# Patient Record
Sex: Female | Born: 1935 | Race: White | Hispanic: No | Marital: Single | Smoking: Former smoker
Health system: Southern US, Community
[De-identification: ages and names within clinical notes are randomized; demographics above are authoritative.]

## PROBLEM LIST (undated history)

## (undated) DIAGNOSIS — I82409 Acute embolism and thrombosis of unspecified deep veins of unspecified lower extremity: Secondary | ICD-10-CM

## (undated) DIAGNOSIS — J40 Bronchitis, not specified as acute or chronic: Secondary | ICD-10-CM

## (undated) DIAGNOSIS — D649 Anemia, unspecified: Secondary | ICD-10-CM

## (undated) DIAGNOSIS — K5790 Diverticulosis of intestine, part unspecified, without perforation or abscess without bleeding: Secondary | ICD-10-CM

## (undated) DIAGNOSIS — M1990 Unspecified osteoarthritis, unspecified site: Secondary | ICD-10-CM

## (undated) DIAGNOSIS — I2510 Atherosclerotic heart disease of native coronary artery without angina pectoris: Secondary | ICD-10-CM

## (undated) DIAGNOSIS — I10 Essential (primary) hypertension: Secondary | ICD-10-CM

## (undated) DIAGNOSIS — K219 Gastro-esophageal reflux disease without esophagitis: Secondary | ICD-10-CM

## (undated) DIAGNOSIS — I2699 Other pulmonary embolism without acute cor pulmonale: Secondary | ICD-10-CM

## (undated) DIAGNOSIS — Z87442 Personal history of urinary calculi: Secondary | ICD-10-CM

## (undated) HISTORY — PX: CHOLECYSTECTOMY: SHX55

## (undated) HISTORY — PX: COLONOSCOPY W/ POLYPECTOMY: SHX1380

## (undated) HISTORY — PX: CORONARY ANGIOPLASTY: SHX604

## (undated) HISTORY — PX: EYE SURGERY: SHX253

## (undated) HISTORY — PX: ANKLE SURGERY: SHX546

## (undated) HISTORY — PX: TONSILLECTOMY: SUR1361

## (undated) HISTORY — PX: APPENDECTOMY: SHX54

## (undated) HISTORY — PX: CARDIAC CATHETERIZATION: SHX172

## (undated) HISTORY — PX: HIP ARTHROPLASTY: SHX981

## (undated) HISTORY — PX: ABDOMINAL HYSTERECTOMY: SHX81

---

## 2015-08-14 ENCOUNTER — Other Ambulatory Visit: Payer: Self-pay | Admitting: Neurological Surgery

## 2015-08-15 NOTE — Pre-Procedure Instructions (Signed)
Michelle NorthMargaret Ritter  08/15/2015     Your procedure is scheduled on : Monday August 19, 2015   [facility] Admitting at 12:30 PM.  Call this number if you have problems the morning of surgery: [phone]   Remember:  Do not eat food or drink liquids after midnight.  Take these medicines the morning of surgery with A SIP OF WATER : Acetaminophen (Tylenol) if needed, Amlodipine (Norvasc), Zegerid if needed, Pantoprazole (Protonix), Sertraline (Zoloft)   Stop taking any vitamins, herbal medications, Ibuprofen, Advil, Motrin, Aleve, etc   Do not wear jewelry, make-up or nail polish.  Do not wear lotions, powders, or perfumes.    Do not shave 48 hours prior to surgery.    Do not bring valuables to the hospital.  [facility] is not responsible for any belongings or valuables.  Contacts, dentures or bridgework may not be worn into surgery.  Leave your suitcase in the car.  After surgery it may be brought to your room.  For patients admitted to the hospital, discharge time will be determined by your treatment team.  Patients discharged the day of surgery will not be allowed to drive home.   Name and phone number of your driver:    Special instructions:  Shower using CHG soap the night before and the morning of your surgery  Please read over the following fact sheets that you were given. Pain Booklet, Coughing and Deep Breathing, MRSA Information and Surgical Site Infection Prevention

## 2015-08-16 ENCOUNTER — Encounter (HOSPITAL_COMMUNITY)
Admission: RE | Admit: 2015-08-16 | Discharge: 2015-08-16 | Disposition: A | Discharge status: Home / Self Care | Payer: Medicare Other | Source: Ambulatory Visit | Attending: Neurological Surgery | Admitting: Neurological Surgery

## 2015-08-16 ENCOUNTER — Encounter (HOSPITAL_COMMUNITY): Payer: Self-pay

## 2015-08-16 HISTORY — DX: Anemia, unspecified: D64.9

## 2015-08-16 HISTORY — DX: Gastro-esophageal reflux disease without esophagitis: K21.9

## 2015-08-16 HISTORY — DX: Acute embolism and thrombosis of unspecified deep veins of unspecified lower extremity: I82.409

## 2015-08-16 HISTORY — DX: Bronchitis, not specified as acute or chronic: J40

## 2015-08-16 HISTORY — DX: Other pulmonary embolism without acute cor pulmonale: I26.99

## 2015-08-16 HISTORY — DX: Unspecified osteoarthritis, unspecified site: M19.90

## 2015-08-16 HISTORY — DX: Atherosclerotic heart disease of native coronary artery without angina pectoris: I25.10

## 2015-08-16 HISTORY — DX: Personal history of urinary calculi: Z87.442

## 2015-08-16 HISTORY — DX: Diverticulosis of intestine, part unspecified, without perforation or abscess without bleeding: K57.90

## 2015-08-16 HISTORY — DX: Essential (primary) hypertension: I10

## 2015-08-16 LAB — CBC
HEMATOCRIT: 40.5 % (ref 36.0–46.0)
HEMOGLOBIN: 13.1 g/dL (ref 12.0–15.0)
MCH: 31 pg (ref 26.0–34.0)
MCHC: 32.3 g/dL (ref 30.0–36.0)
MCV: 96 fL (ref 78.0–100.0)
Platelets: 197 10*3/uL (ref 150–400)
RBC: 4.22 MIL/uL (ref 3.87–5.11)
RDW: 14 % (ref 11.5–15.5)
WBC: 6.6 10*3/uL (ref 4.0–10.5)

## 2015-08-16 LAB — BASIC METABOLIC PANEL
ANION GAP: 9 (ref 5–15)
BUN: 15 mg/dL (ref 6–20)
CALCIUM: 9.1 mg/dL (ref 8.9–10.3)
CHLORIDE: 100 mmol/L — AB (ref 101–111)
CO2: 26 mmol/L (ref 22–32)
Creatinine, Ser: 0.69 mg/dL (ref 0.44–1.00)
GFR calc non Af Amer: >60 mL/min (ref >60)
Glucose, Bld: 103 mg/dL — ABNORMAL HIGH (ref 65–99)
Potassium: 3.8 mmol/L (ref 3.5–5.1)
SODIUM: 135 mmol/L (ref 135–145)

## 2015-08-16 LAB — SURGICAL PCR SCREEN
MRSA, PCR: NEGATIVE
STAPHYLOCOCCUS AUREUS: NEGATIVE

## 2015-08-16 NOTE — Progress Notes (Signed)
PCP is Lowella DellZachary Sandbulte  Cardiologist is Bill SalinasKerry Gilliland at [facility]. Patient informed Nurse that she had a cardiac [facility], and has one stent placed in her heart. LOV with Dr. Lehman PromGilliand was "sometime this year." Will request records.  Patient denied having any acute cardiac or pulmonary issues

## 2015-08-16 NOTE — Progress Notes (Signed)
Anesthesia Chart Review:  Pt is 81 year old female scheduled for L3-4, L4-5 laminectomy with coflex on 08/19/2015 with Dr. Danielle DessElsner.   PCP is Dr. Lowella DellZachary Sandbulte (care everywhere). Cardiologist is Dr. Bill SalinasKerry Gilliland at [facility] in [place] (care everywhere), last office visit 04/23/15; f/u recommended in 1 year.   PMH includes: CAD (BMS to LAD 08/2013; last cath 01/2015), sick sinus syndrome, HTN, DVT, PE, anemia, GERD. Former smoker. BMI 28.   Medications include: amlodipine, ASA, iron, zegerid, protonix  Preoperative labs reviewed.    EKG 08/16/2015: sinus bradycardia (59 bpm).   Cardiac cath 02/06/2015 (care everywhere): 30-40% to LAD stent, 5-10% Cx, 40% RCA, 90% ostial Diagonal (unchanged from prior cath 2014) => Med Rx.  Echo 08/31/2013 (care everywhere): - The left ventricle is normal in size. - There is mild concentric left ventricular hypertrophy with normal wall motion and ejection fraction 60-65%. - There is mild (1+) tricuspid regurgitation. - The aortic valve is trileaflet with thin, pliable leaflets that move normally. - Transmitral Doppler flow pattern is normal for age. - The left atrium is mildly dilated.  Reviewed case with Dr. Renold DonGermeroth.   If no changes, I anticipate pt can proceed with surgery as scheduled.   Rica Mastngela Gatlyn Lipari, FNP-BC [facility]Anesthesiology Phone: [phone] 08/16/2015 4:37 PM

## 2015-08-19 ENCOUNTER — Encounter (HOSPITAL_COMMUNITY): Payer: Self-pay | Admitting: *Deleted

## 2015-08-19 ENCOUNTER — Ambulatory Visit (HOSPITAL_COMMUNITY): Payer: Medicare Other

## 2015-08-19 ENCOUNTER — Ambulatory Visit (HOSPITAL_COMMUNITY): Payer: Medicare Other | Admitting: Anesthesiology

## 2015-08-19 ENCOUNTER — Inpatient Hospital Stay (HOSPITAL_COMMUNITY)
Admission: AD | Admit: 2015-08-19 | Discharge: 2015-08-21 | DRG: 518 | Disposition: A | Discharge status: Home / Self Care | Payer: Medicare Other | Source: Ambulatory Visit | Attending: Neurological Surgery | Admitting: Neurological Surgery

## 2015-08-19 ENCOUNTER — Ambulatory Visit (HOSPITAL_COMMUNITY): Payer: Medicare Other | Admitting: Emergency Medicine

## 2015-08-19 ENCOUNTER — Encounter (HOSPITAL_COMMUNITY)
Admission: AD | Disposition: A | Discharge status: Home / Self Care | Payer: Self-pay | Source: Ambulatory Visit | Attending: Neurological Surgery

## 2015-08-19 DIAGNOSIS — I10 Essential (primary) hypertension: Secondary | ICD-10-CM | POA: Diagnosis present

## 2015-08-19 DIAGNOSIS — Z7982 Long term (current) use of aspirin: Secondary | ICD-10-CM | POA: Diagnosis not present

## 2015-08-19 DIAGNOSIS — Z86718 Personal history of other venous thrombosis and embolism: Secondary | ICD-10-CM | POA: Diagnosis not present

## 2015-08-19 DIAGNOSIS — Z87891 Personal history of nicotine dependence: Secondary | ICD-10-CM

## 2015-08-19 DIAGNOSIS — I2510 Atherosclerotic heart disease of native coronary artery without angina pectoris: Secondary | ICD-10-CM | POA: Diagnosis present

## 2015-08-19 DIAGNOSIS — Z419 Encounter for procedure for purposes other than remedying health state, unspecified: Secondary | ICD-10-CM

## 2015-08-19 DIAGNOSIS — Z01812 Encounter for preprocedural laboratory examination: Secondary | ICD-10-CM

## 2015-08-19 DIAGNOSIS — M4316 Spondylolisthesis, lumbar region: Secondary | ICD-10-CM | POA: Diagnosis present

## 2015-08-19 DIAGNOSIS — M4806 Spinal stenosis, lumbar region: Principal | ICD-10-CM | POA: Diagnosis present

## 2015-08-19 DIAGNOSIS — M4726 Other spondylosis with radiculopathy, lumbar region: Secondary | ICD-10-CM | POA: Diagnosis present

## 2015-08-19 DIAGNOSIS — Z955 Presence of coronary angioplasty implant and graft: Secondary | ICD-10-CM | POA: Diagnosis not present

## 2015-08-19 DIAGNOSIS — K219 Gastro-esophageal reflux disease without esophagitis: Secondary | ICD-10-CM | POA: Diagnosis present

## 2015-08-19 DIAGNOSIS — M48061 Spinal stenosis, lumbar region without neurogenic claudication: Secondary | ICD-10-CM | POA: Diagnosis present

## 2015-08-19 DIAGNOSIS — Z01810 Encounter for preprocedural cardiovascular examination: Secondary | ICD-10-CM | POA: Diagnosis not present

## 2015-08-19 DIAGNOSIS — Z79899 Other long term (current) drug therapy: Secondary | ICD-10-CM | POA: Diagnosis not present

## 2015-08-19 DIAGNOSIS — Z86711 Personal history of pulmonary embolism: Secondary | ICD-10-CM | POA: Diagnosis not present

## 2015-08-19 DIAGNOSIS — M419 Scoliosis, unspecified: Secondary | ICD-10-CM | POA: Diagnosis present

## 2015-08-19 DIAGNOSIS — M549 Dorsalgia, unspecified: Secondary | ICD-10-CM | POA: Diagnosis present

## 2015-08-19 HISTORY — PX: LUMBAR LAMINECTOMY WITH COFLEX 2 LEVEL: SHX6515

## 2015-08-19 SURGERY — LUMBAR LAMINECTOMY WITH COFLEX 2 LEVEL
Anesthesia: General | Site: Back | Laterality: Left

## 2015-08-19 MED ORDER — OXYCODONE-ACETAMINOPHEN 5-325 MG PO TABS
1.0000 | ORAL_TABLET | ORAL | Status: DC | PRN
  Administered 2015-08-19: 1 via ORAL
  Filled 2015-08-19: qty 1

## 2015-08-19 MED ORDER — POLYETHYLENE GLYCOL 3350 17 G PO PACK: 17.0000 g | PACK | Freq: Every day | ORAL | Status: DC | PRN

## 2015-08-19 MED ORDER — MIDAZOLAM HCL 2 MG/2ML IJ SOLN
INTRAMUSCULAR | Status: AC
  Filled 2015-08-19: qty 4

## 2015-08-19 MED ORDER — PHENOL 1.4 % MT LIQD: 1.0000 | OROMUCOSAL | Status: DC | PRN

## 2015-08-19 MED ORDER — PANTOPRAZOLE SODIUM 40 MG PO TBEC
40.0000 mg | DELAYED_RELEASE_TABLET | Freq: Two times a day (BID) | ORAL | Status: DC
  Administered 2015-08-19 – 2015-08-20 (×3): 40 mg via ORAL
  Filled 2015-08-19 (×3): qty 1

## 2015-08-19 MED ORDER — SERTRALINE HCL 50 MG PO TABS
25.0000 mg | ORAL_TABLET | Freq: Every day | ORAL | Status: DC
  Administered 2015-08-19 – 2015-08-20 (×2): 25 mg via ORAL
  Filled 2015-08-19 (×2): qty 1

## 2015-08-19 MED ORDER — ONDANSETRON HCL 4 MG/2ML IJ SOLN
INTRAMUSCULAR | Status: DC | PRN
  Administered 2015-08-19: 4 mg via INTRAVENOUS

## 2015-08-19 MED ORDER — ONDANSETRON HCL 4 MG/2ML IJ SOLN
INTRAMUSCULAR | Status: AC
  Filled 2015-08-19: qty 2

## 2015-08-19 MED ORDER — VANCOMYCIN HCL IN DEXTROSE 1-5 GM/200ML-% IV SOLN: 1000.0000 mg | Freq: Once | INTRAVENOUS | Status: DC

## 2015-08-19 MED ORDER — PROMETHAZINE HCL 25 MG/ML IJ SOLN
INTRAMUSCULAR | Status: AC
  Filled 2015-08-19: qty 1

## 2015-08-19 MED ORDER — SODIUM CHLORIDE 0.9 % IJ SOLN
3.0000 mL | Freq: Two times a day (BID) | INTRAMUSCULAR | Status: DC
  Administered 2015-08-20: 3 mL via INTRAVENOUS

## 2015-08-19 MED ORDER — FLEET ENEMA 7-19 GM/118ML RE ENEM: 1.0000 | ENEMA | Freq: Once | RECTAL | Status: DC | PRN

## 2015-08-19 MED ORDER — NITROGLYCERIN 0.4 MG SL SUBL
0.4000 mg | SUBLINGUAL_TABLET | SUBLINGUAL | Status: DC | PRN
Start: 2015-08-19 — End: 2015-08-21

## 2015-08-19 MED ORDER — HYDROCODONE-ACETAMINOPHEN 5-325 MG PO TABS
1.0000 | ORAL_TABLET | ORAL | Status: DC | PRN
  Administered 2015-08-19: 2 via ORAL
  Filled 2015-08-19: qty 2

## 2015-08-19 MED ORDER — LIDOCAINE HCL (CARDIAC) 20 MG/ML IV SOLN
INTRAVENOUS | Status: AC
  Filled 2015-08-19: qty 5

## 2015-08-19 MED ORDER — GLYCOPYRROLATE 0.2 MG/ML IJ SOLN
INTRAMUSCULAR | Status: DC | PRN
  Administered 2015-08-19: 0.4 mg via INTRAVENOUS

## 2015-08-19 MED ORDER — NEOSTIGMINE METHYLSULFATE 10 MG/10ML IV SOLN
INTRAVENOUS | Status: DC | PRN
  Administered 2015-08-19: 3 mg via INTRAVENOUS

## 2015-08-19 MED ORDER — EPHEDRINE SULFATE 50 MG/ML IJ SOLN
INTRAMUSCULAR | Status: DC | PRN
  Administered 2015-08-19: 10 mg via INTRAVENOUS

## 2015-08-19 MED ORDER — MENTHOL 3 MG MT LOZG: 1.0000 | LOZENGE | OROMUCOSAL | Status: DC | PRN

## 2015-08-19 MED ORDER — SODIUM CHLORIDE 0.9 % IV SOLN: 250.0000 mL | INTRAVENOUS | Status: DC

## 2015-08-19 MED ORDER — FENTANYL CITRATE (PF) 250 MCG/5ML IJ SOLN
INTRAMUSCULAR | Status: AC
  Filled 2015-08-19: qty 5

## 2015-08-19 MED ORDER — NEOSTIGMINE METHYLSULFATE 10 MG/10ML IV SOLN
INTRAVENOUS | Status: AC
  Filled 2015-08-19: qty 1

## 2015-08-19 MED ORDER — LIDOCAINE-EPINEPHRINE 1 %-1:100000 IJ SOLN
INTRAMUSCULAR | Status: DC | PRN
  Administered 2015-08-19: 10 mL

## 2015-08-19 MED ORDER — ACETAMINOPHEN 325 MG PO TABS
650.0000 mg | ORAL_TABLET | ORAL | Status: DC | PRN
  Administered 2015-08-21: 650 mg via ORAL
  Filled 2015-08-19: qty 2

## 2015-08-19 MED ORDER — ONDANSETRON HCL 4 MG/2ML IJ SOLN
4.0000 mg | INTRAMUSCULAR | Status: DC | PRN
  Administered 2015-08-19 – 2015-08-20 (×3): 4 mg via INTRAVENOUS
  Filled 2015-08-19 (×3): qty 2

## 2015-08-19 MED ORDER — METHOCARBAMOL 500 MG PO TABS
500.0000 mg | ORAL_TABLET | Freq: Four times a day (QID) | ORAL | Status: DC | PRN
  Administered 2015-08-19: 500 mg via ORAL
  Filled 2015-08-19: qty 1

## 2015-08-19 MED ORDER — LACTATED RINGERS IV SOLN
INTRAVENOUS | Status: DC
  Administered 2015-08-19 (×3): via INTRAVENOUS

## 2015-08-19 MED ORDER — ROCURONIUM BROMIDE 50 MG/5ML IV SOLN
INTRAVENOUS | Status: AC
  Filled 2015-08-19: qty 1

## 2015-08-19 MED ORDER — MORPHINE SULFATE (PF) 2 MG/ML IV SOLN
1.0000 mg | INTRAVENOUS | Status: DC | PRN
  Administered 2015-08-19 – 2015-08-20 (×2): 2 mg via INTRAVENOUS
  Filled 2015-08-19 (×2): qty 1

## 2015-08-19 MED ORDER — ROCURONIUM BROMIDE 100 MG/10ML IV SOLN
INTRAVENOUS | Status: DC | PRN
  Administered 2015-08-19: 40 mg via INTRAVENOUS

## 2015-08-19 MED ORDER — PANTOPRAZOLE SODIUM 40 MG PO TBEC: 40.0000 mg | DELAYED_RELEASE_TABLET | Freq: Every day | ORAL | Status: DC

## 2015-08-19 MED ORDER — HEMOSTATIC AGENTS (NO CHARGE) OPTIME
TOPICAL | Status: DC | PRN
  Administered 2015-08-19: 1 via TOPICAL

## 2015-08-19 MED ORDER — PHENYLEPHRINE HCL 10 MG/ML IJ SOLN
INTRAMUSCULAR | Status: DC | PRN
  Administered 2015-08-19 (×4): 80 ug via INTRAVENOUS

## 2015-08-19 MED ORDER — LIDOCAINE HCL (CARDIAC) 20 MG/ML IV SOLN
INTRAVENOUS | Status: DC | PRN
  Administered 2015-08-19: 50 mg via INTRAVENOUS

## 2015-08-19 MED ORDER — PROPOFOL 10 MG/ML IV BOLUS
INTRAVENOUS | Status: DC | PRN
  Administered 2015-08-19: 120 mg via INTRAVENOUS

## 2015-08-19 MED ORDER — ACETAMINOPHEN 10 MG/ML IV SOLN
INTRAVENOUS | Status: AC
  Administered 2015-08-19: 1000 mg via INTRAVENOUS
  Filled 2015-08-19: qty 100

## 2015-08-19 MED ORDER — SODIUM CHLORIDE 0.9 % IR SOLN
Status: DC | PRN
  Administered 2015-08-19: 16:00:00

## 2015-08-19 MED ORDER — HYDROMORPHONE HCL 1 MG/ML IJ SOLN
0.2500 mg | INTRAMUSCULAR | Status: DC | PRN
  Administered 2015-08-19: 0.25 mg via INTRAVENOUS

## 2015-08-19 MED ORDER — GLYCOPYRROLATE 0.2 MG/ML IJ SOLN
INTRAMUSCULAR | Status: AC
  Filled 2015-08-19: qty 2

## 2015-08-19 MED ORDER — SODIUM CHLORIDE 0.9 % IJ SOLN: 3.0000 mL | INTRAMUSCULAR | Status: DC | PRN

## 2015-08-19 MED ORDER — DOCUSATE SODIUM 100 MG PO CAPS
100.0000 mg | ORAL_CAPSULE | Freq: Two times a day (BID) | ORAL | Status: DC
  Administered 2015-08-19 – 2015-08-20 (×3): 100 mg via ORAL
  Filled 2015-08-19 (×3): qty 1

## 2015-08-19 MED ORDER — ALUM & MAG HYDROXIDE-SIMETH 200-200-20 MG/5ML PO SUSP
30.0000 mL | Freq: Four times a day (QID) | ORAL | Status: DC | PRN
  Administered 2015-08-20: 30 mL via ORAL
  Filled 2015-08-19: qty 30

## 2015-08-19 MED ORDER — FENTANYL CITRATE (PF) 100 MCG/2ML IJ SOLN
INTRAMUSCULAR | Status: DC | PRN
  Administered 2015-08-19: 50 ug via INTRAVENOUS
  Administered 2015-08-19: 100 ug via INTRAVENOUS

## 2015-08-19 MED ORDER — BUPIVACAINE HCL (PF) 0.5 % IJ SOLN
INTRAMUSCULAR | Status: DC | PRN
  Administered 2015-08-19: 20 mL
  Administered 2015-08-19: 10 mL

## 2015-08-19 MED ORDER — 0.9 % SODIUM CHLORIDE (POUR BTL) OPTIME
TOPICAL | Status: DC | PRN
  Administered 2015-08-19: 1000 mL

## 2015-08-19 MED ORDER — BISACODYL 10 MG RE SUPP: 10.0000 mg | Freq: Every day | RECTAL | Status: DC | PRN

## 2015-08-19 MED ORDER — HYDROMORPHONE HCL 1 MG/ML IJ SOLN
INTRAMUSCULAR | Status: AC
  Filled 2015-08-19: qty 1

## 2015-08-19 MED ORDER — SENNA 8.6 MG PO TABS
1.0000 | ORAL_TABLET | Freq: Two times a day (BID) | ORAL | Status: DC
  Administered 2015-08-19 – 2015-08-20 (×3): 8.6 mg via ORAL
  Filled 2015-08-19 (×3): qty 1

## 2015-08-19 MED ORDER — PROMETHAZINE HCL 25 MG/ML IJ SOLN: 6.2500 mg | INTRAMUSCULAR | Status: DC | PRN

## 2015-08-19 MED ORDER — THROMBIN 5000 UNITS EX SOLR
CUTANEOUS | Status: DC | PRN
  Administered 2015-08-19 (×2): 5000 [IU] via TOPICAL

## 2015-08-19 MED ORDER — AMLODIPINE BESYLATE 5 MG PO TABS
5.0000 mg | ORAL_TABLET | Freq: Every day | ORAL | Status: DC
  Administered 2015-08-20: 5 mg via ORAL
  Filled 2015-08-19 (×2): qty 1

## 2015-08-19 MED ORDER — ACETAMINOPHEN 650 MG RE SUPP: 650.0000 mg | RECTAL | Status: DC | PRN

## 2015-08-19 MED ORDER — MEPERIDINE HCL 25 MG/ML IJ SOLN: 6.2500 mg | INTRAMUSCULAR | Status: DC | PRN

## 2015-08-19 MED ORDER — DEXTROSE 5 % IV SOLN
500.0000 mg | Freq: Four times a day (QID) | INTRAVENOUS | Status: DC | PRN
  Filled 2015-08-19: qty 5

## 2015-08-19 MED ORDER — VANCOMYCIN HCL IN DEXTROSE 1-5 GM/200ML-% IV SOLN
INTRAVENOUS | Status: AC
  Administered 2015-08-19: 1000 mg via INTRAVENOUS
  Filled 2015-08-19: qty 200

## 2015-08-19 MED ORDER — PROPOFOL 10 MG/ML IV BOLUS
INTRAVENOUS | Status: AC
  Filled 2015-08-19: qty 20

## 2015-08-19 SURGICAL SUPPLY — 51 items
BAG DECANTER FOR FLEXI CONT (MISCELLANEOUS) ×3 IMPLANT
BLADE CLIPPER SURG (BLADE) IMPLANT
BUR ACORN 6.0 (BURR) IMPLANT
BUR ACORN 6.0MM (BURR)
BUR MATCHSTICK NEURO 3.0 LAGG (BURR) ×3 IMPLANT
CANISTER SUCT 3000ML PPV (MISCELLANEOUS) ×3 IMPLANT
DECANTER SPIKE VIAL GLASS SM (MISCELLANEOUS) ×3 IMPLANT
DERMABOND ADVANCED (GAUZE/BANDAGES/DRESSINGS) ×2
DERMABOND ADVANCED .7 DNX12 (GAUZE/BANDAGES/DRESSINGS) ×1 IMPLANT
DEVICE COFLEX STABLIZATION 8MM (Neuro Prosthesis/Implant) ×3 IMPLANT
DRAPE C-ARM 42X72 X-RAY (DRAPES) ×6 IMPLANT
DRAPE C-ARMOR (DRAPES) ×3 IMPLANT
DRAPE LAPAROTOMY T 102X78X121 (DRAPES) ×3 IMPLANT
DRAPE MICROSCOPE LEICA (MISCELLANEOUS) IMPLANT
DRAPE POUCH INSTRU U-SHP 10X18 (DRAPES) ×3 IMPLANT
DRAPE PROXIMA HALF (DRAPES) IMPLANT
DRSG OPSITE POSTOP 4X6 (GAUZE/BANDAGES/DRESSINGS) ×3 IMPLANT
DURAPREP 26ML APPLICATOR (WOUND CARE) ×3 IMPLANT
ELECT REM PT RETURN 9FT ADLT (ELECTROSURGICAL) ×3
ELECTRODE REM PT RTRN 9FT ADLT (ELECTROSURGICAL) ×1 IMPLANT
GAUZE SPONGE 4X4 12PLY STRL (GAUZE/BANDAGES/DRESSINGS) ×3 IMPLANT
GAUZE SPONGE 4X4 16PLY XRAY LF (GAUZE/BANDAGES/DRESSINGS) IMPLANT
GLOVE BIOGEL PI IND STRL 8.5 (GLOVE) ×1 IMPLANT
GLOVE BIOGEL PI INDICATOR 8.5 (GLOVE) ×2
GLOVE ECLIPSE 8.5 STRL (GLOVE) ×3 IMPLANT
GLOVE EXAM NITRILE LRG STRL (GLOVE) IMPLANT
GLOVE EXAM NITRILE MD LF STRL (GLOVE) IMPLANT
GLOVE EXAM NITRILE XL STR (GLOVE) IMPLANT
GLOVE EXAM NITRILE XS STR PU (GLOVE) IMPLANT
GOWN STRL REUS W/ TWL LRG LVL3 (GOWN DISPOSABLE) IMPLANT
GOWN STRL REUS W/ TWL XL LVL3 (GOWN DISPOSABLE) IMPLANT
GOWN STRL REUS W/TWL 2XL LVL3 (GOWN DISPOSABLE) ×3 IMPLANT
GOWN STRL REUS W/TWL LRG LVL3 (GOWN DISPOSABLE)
GOWN STRL REUS W/TWL XL LVL3 (GOWN DISPOSABLE)
KIT BASIN OR (CUSTOM PROCEDURE TRAY) ×3 IMPLANT
KIT ROOM TURNOVER OR (KITS) ×3 IMPLANT
NEEDLE HYPO 22GX1.5 SAFETY (NEEDLE) ×3 IMPLANT
NEEDLE SPNL 20GX3.5 QUINCKE YW (NEEDLE) IMPLANT
NS IRRIG 1000ML POUR BTL (IV SOLUTION) ×3 IMPLANT
PACK LAMINECTOMY NEURO (CUSTOM PROCEDURE TRAY) ×3 IMPLANT
PAD ARMBOARD 7.5X6 YLW CONV (MISCELLANEOUS) ×9 IMPLANT
PATTIES SURGICAL .5 X1 (DISPOSABLE) IMPLANT
RUBBERBAND STERILE (MISCELLANEOUS) IMPLANT
SPONGE SURGIFOAM ABS GEL SZ50 (HEMOSTASIS) ×3 IMPLANT
SUT VIC AB 1 CT1 18XBRD ANBCTR (SUTURE) ×1 IMPLANT
SUT VIC AB 1 CT1 8-18 (SUTURE) ×2
SUT VIC AB 2-0 CP2 18 (SUTURE) ×3 IMPLANT
SUT VIC AB 3-0 SH 8-18 (SUTURE) ×6 IMPLANT
TOWEL OR 17X24 6PK STRL BLUE (TOWEL DISPOSABLE) ×3 IMPLANT
TOWEL OR 17X26 10 PK STRL BLUE (TOWEL DISPOSABLE) ×3 IMPLANT
WATER STERILE IRR 1000ML POUR (IV SOLUTION) ×3 IMPLANT

## 2015-08-19 NOTE — H&P (Signed)
CHIEF COMPLAINT:                              Back, buttock, left-sided pain.                                                                                                              HISTORY OF PRESENT ILLNESS:                     Michelle Ritter is an 81 year old, right-handed individual whom I have seen and treated with a number of lumbar epidural steroid injections in the past.  She has always gotten good longterm relief but she notes this most recent injection about a month ago has not given her substantial relief at all.  She notes the pain seems to center across the left posterior suprailiac crest and in the region of the gluteus.  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  Nonetheless, the pain seems to be recurring and is limiting her capacity on her feet.  She finds that if she sits down, after a while the pain will tend to ease.    Clinically, Michelle Ritter but her capacity to walk distances is somewhat limited by the fact that she has the left buttock pain.              DATA:                                                  Previously she has had an MRI back in 2014 and plain x-rays also around that time were reviewed and compared to new radiographs of the lumbar spine.  I previously noted that Michelle Ritter had slight spondylolisthesis at the L5-S1 level in addition to some significant spondylytic changes at multiple levels with an early degenerative scoliosis that was forming.  Today's x-rays compare favorably to the previous x-rays save for the fact that she appears to be advancing the degree of spondylolisthesis that she has at L5-S1.  The scoliosis appears unchanged.  Some modest disk degenerative changes at multiple levels also may be slightly worse now but only marginally so.  Her alignment in the coronal plane actually appears quite good.              PHYSICAL  EXAMINATION:  She is alert, oriented and mobile. Heart ,lungs, abdomen exam is normal.                  Her motor function is Ritter grossly in the iliopsoas, quad, tibialis anterior, and gastrocs as noted by her ability to walk in the office today.  DATA:                                                  I obtained some plain radiographs, which demonstrated that Michelle Ritter had the early formation of degenerative scoliosis.  This was worse at the L3 and L4 levels.  The MRI demonstrates that indeed she has those scoliotic deformities forming, but she has substantial stenosis with the nerve root at the L3-4 and the L4-5 level.  This involves mostly the L3 nerve root superiorly and the L4 nerve root inferiorly.   IMPRESSION/PLAN:                             In looking over the options, I noted to Michelle Ritter that a simple decompression would likely give her relief transiently, but I am concerned that with collapse of the space further after the decompression, she would reform the stenosis.  The other options involve doing a fusion.  One option would be do an anterolateral decompression with an XLIF device to stabilize the spine that way.  This still requires that she heal the fusion solidly and it is a substantial operation that even though it may sound better than having a rod placed on the back.  The other option of course is to place a rod in her back from L3 to L5, which is a very large operation.  I discussed another alternative, which I believe may be her best option for surgical consideration in this situation.  This would be do an laminectomy and decompression at L3-4 and L4-5 and then place a Coflex spacer posteriorly to help maintain the integrity of the decompression.  I demonstrated the device on a model to her and explained its principle.  Overall, to achieve some longterm relief from her radicular pain in the left leg, I believe that a decompression at L3-4 and L4-5 with placement of  Coflex offers her the best chance for a long-term solution.  After some discussion, Michelle Ritter notes that the pain, the way it is now is now, is not tolerable and she is admitted for surgery now.

## 2015-08-19 NOTE — Anesthesia Procedure Notes (Signed)
Procedure Name: Intubation Date/Time: 08/19/2015 2:51 PM Performed by: Orlinda BlalockMCMILLEN, Asmar Brozek L Pre-anesthesia Checklist: Patient identified, Emergency Drugs available, Suction available, Patient being monitored and Timeout performed Patient Re-evaluated:Patient Re-evaluated prior to inductionOxygen Delivery Method: Circle system utilized Preoxygenation: Pre-oxygenation with 100% oxygen Intubation Type: IV induction Ventilation: Mask ventilation without difficulty Laryngoscope Size: Mac and 3 Grade View: Grade I Tube type: Oral Tube size: 7.5 mm Number of attempts: 1 Airway Equipment and Method: Stylet Placement Confirmation: ETT inserted through vocal cords under direct vision,  positive ETCO2 and breath sounds checked- equal and bilateral Secured at: 20 cm Tube secured with: Tape Dental Injury: Teeth and Oropharynx as per pre-operative assessment

## 2015-08-19 NOTE — Anesthesia Preprocedure Evaluation (Signed)
Anesthesia Evaluation  Patient identified by MRN, date of birth, ID band Patient awake    Reviewed: Allergy & Precautions, NPO status , Patient's Chart, lab work & pertinent test results  Airway Mallampati: II  TM Distance: >3 FB Neck ROM: Full    Dental no notable dental hx.    Pulmonary neg pulmonary ROS, former smoker,    Pulmonary exam normal breath sounds clear to auscultation       Cardiovascular hypertension, Pt. on medications + CAD  Normal cardiovascular exam Rhythm:Regular Rate:Normal     Neuro/Psych negative neurological ROS  negative psych ROS   GI/Hepatic Neg liver ROS, GERD  Medicated,  Endo/Other  negative endocrine ROS  Renal/GU negative Renal ROS     Musculoskeletal  (+) Arthritis ,   Abdominal   Peds  Hematology  (+) Blood dyscrasia, anemia ,   Anesthesia Other Findings   Reproductive/Obstetrics negative OB ROS                             Anesthesia Physical Anesthesia Plan  ASA: III  Anesthesia Plan: General   Post-op Pain Management:    Induction: Intravenous  Airway Management Planned: Oral ETT  Additional Equipment:   Intra-op Plan:   Post-operative Plan: Extubation in OR  Informed Consent: I have reviewed the patients History and Physical, chart, labs and discussed the procedure including the risks, benefits and alternatives for the proposed anesthesia with the patient or authorized representative who has indicated his/her understanding and acceptance.   Dental advisory given  Plan Discussed with: CRNA  Anesthesia Plan Comments:         Anesthesia Quick Evaluation

## 2015-08-19 NOTE — Transfer of Care (Signed)
Immediate Anesthesia Transfer of Care Note  Patient: Michelle Ritter  Procedure(s) Performed: Procedure(s) with comments: Lumbar three- lumbar four, lumbar four- lumbar five laminectomy and foraminotomy with lumbar three- lumbar four coflex  (Left) - Left L34 L45 lamienctomy and foraminotomy with coflex  Patient Location: PACU  Anesthesia Type:General  Level of Consciousness: awake, alert  and oriented  Airway & Oxygen Therapy: Patient Spontanous Breathing and Patient connected to nasal cannula oxygen  Post-op Assessment: Report given to RN, Post -op Vital signs reviewed and stable and Patient moving all extremities  Post vital signs: Reviewed and stable  Last Vitals:  Filed Vitals:   08/19/15 1209  BP: 137/47  Pulse:   Temp:   Resp:     Complications: No apparent anesthesia complications

## 2015-08-19 NOTE — Op Note (Signed)
Date of surgery: 08/19/2015 Preoperative diagnosis: Lumbar spinal stenosis L3-4 L4-5 with left-sided radiculopathy Postoperative diagnosis: Lumbar spinal stenosis L3-4 L4-5 with left-sided radiculopathy Procedure: Bilateral laminotomies and foraminotomies, decompression of spinal canal L3-4 and L4-5, placement of Coflex L3-4 Surgeon: Barnett AbuHenry Michelle Ritter First assistant: Lelon PerlaHenry Poole M.D. Anesthesia: Gen. endotracheal Indications: Michelle NorthMargaret Ritter is an 81 year old individual who has had significant back pain and leg pain she is failed all manner of conservative therapy and for the last number of years is been receiving intermittent epidural injections until these have become on effective she's been advised regarding surgical intervention to involve bilateral laminotomies and foraminotomies at L3-4 and L4-5 the worst levels of her stenosis. To maintain the integrity of the decompression I have suggested we'll plan on implanting Coflex devices at each level.  Procedure: Patient was brought to the operating room supine on a stretcher. After the smooth induction of general endotracheal anesthesia, she was turned prone. The bony prominences were appropriately padded and protected. The back was prepped with alcohol and DuraPrep and draped in a sterile fashion. A midline incision was made in the lower portion of the lumbar spine and carried down to the lumbar dorsal fascia. Fascia was opened on either side of the midline. The first identifiable spinous process was noted to be that of L3 then dissection was carried down the interlaminar space at L3-4 and L4-5. The spines are noted be fairly stiff and rigid. Laminotomies were then carried out first on the left side at L3-4 and L4-5 and then on the right side at L3-4 and L4-5. The yellow ligament was thickened and redundant it was taken up and renal moved and then at each level a decompression was performed laterally to remove thick and redundant yellow ligament and also some  facet hypertrophy that was encountered. Superiorly then the L3 nerve root was decompressed with a 2 mm Kerrison punch and also care of Kerrison punch was used to facilitate this. Inferiorly the L4 nerve root was decompressed at L3-4 and then again from the superior aspect of the L4-5 laminotomy the L4 nerve root was decompressed and L5 nerve root was decompressed from the inferior approach. This procedure was carried out at all 4 levels once an adequate decompression was obtained interspinous ligament was taken down in the interspinous spaces were explored during a takedown of the interspinous ligament the superior portion of the spinous process of L5 was fractured. This did not leave adequate spinous process for attachment of the Coflex device at the L4-L5 level at L3-L4 the dissection was completed such that an 8 mm Coflex device could be applied this was done at L3-L4 and final radiographs identified good position Coflex at L3-L4 no Coflex at L4-L5 to assure the decompression remain adequate also is were reexplored next generous laminotomy and dissection was carried out laterally at the L4-5 level. Once hemostasis was achieved the lumbar dorsal fascia was closed with #1 Vicryls in interrupted fashion 20 Vicryls used in subcutaneous take her tissues and 30 Vicryls subcuticularly Dermabond was placed on the skin blood loss was estimated at 100 mL. The patient tolerated procedure was returned to recovery room stable condition.

## 2015-08-19 NOTE — Progress Notes (Signed)
Patient allergic to Penicillins, Ancef ordered by MD, notified MD about patient allergy, new orders received.

## 2015-08-20 ENCOUNTER — Encounter (HOSPITAL_COMMUNITY): Payer: Self-pay | Admitting: Neurological Surgery

## 2015-08-20 MED ORDER — DEXAMETHASONE SODIUM PHOSPHATE 4 MG/ML IJ SOLN
4.0000 mg | Freq: Two times a day (BID) | INTRAMUSCULAR | Status: DC
  Administered 2015-08-20 (×2): 4 mg via INTRAVENOUS
  Filled 2015-08-20 (×2): qty 1

## 2015-08-20 MED ORDER — KETOROLAC TROMETHAMINE 15 MG/ML IJ SOLN
15.0000 mg | Freq: Four times a day (QID) | INTRAMUSCULAR | Status: AC
  Administered 2015-08-20 – 2015-08-21 (×5): 15 mg via INTRAVENOUS
  Filled 2015-08-20 (×5): qty 1

## 2015-08-20 NOTE — Evaluation (Signed)
Occupational Therapy Evaluation Patient Details Name: Michelle NorthMargaret Ritter MRN: [id number] DOB: [birth year] Today's Date: 08/20/2015    History of Present Illness 81 y.o. female s/p bilateral laminotomies and foraminotomies, decompression of spinal canal L3-4 and L4-5, placement of coflex L3-4.   Clinical Impression   Pt admitted to hospital due to reason stated above. Pt currently with functional limitiations due to deficits listed below (see OT problem list). Prior to admission pt was independent with ADLs/IADLs. Pt currently requires minimal assistance for ADLs. Pt was educated in back precautions and provided with handout and was able to recall 2 out of 3 precautions using teach back method. Session limited due to onset of dizziness and nausea while sitting EOB. Pt reported head spinning while performing log roll technique to sit EOB. Pt will benefit from skilled OT to increase her independence and safety with ADLs and balance to allow safe discharge home.    Follow Up Recommendations  Supervision - Intermittent    Equipment Recommendations  3 in 1 bedside comode    Recommendations for Other Services       Precautions / Restrictions Precautions Precautions: Back;Fall Precaution Booklet Issued: Yes (comment) Precaution Comments: Educated in back precautions; no back brace at time of session however managing orders call for aspen lumbar brace Restrictions Weight Bearing Restrictions: No      Mobility Bed Mobility Overal bed mobility: Needs Assistance Bed Mobility: Rolling;Sidelying to Sit Rolling: Supervision Sidelying to sit: Supervision       General bed mobility comments: instructed in log roll technique, pt able to return safe demonstration  Transfers                 General transfer comment: Unable to perform sit to stand transfer due to onset of dizziness and nausea with positional changes    Balance Overall balance assessment: Needs assistance Sitting-balance  support: No upper extremity supported;Feet supported Sitting balance-Leahy Scale: Fair                                      ADL Overall ADL's : Needs assistance/impaired Eating/Feeding: Independent;Sitting   Grooming: Wash/dry face;Set up;Sitting Grooming Details (indicate cue type and reason): educated in two cup method for oral care Upper Body Bathing: Minimal assitance;Sitting   Lower Body Bathing: Minimal assistance;Sitting/lateral leans   Upper Body Dressing : Minimal assistance;Sitting   Lower Body Dressing: Minimal assistance;Sitting/lateral leans;Sit to/from stand Lower Body Dressing Details (indicate cue type and reason): pt able to don LLE sock using sock aid               General ADL Comments: Pt educated in back precautions regarding ADL safety. Pt educated in adaptive equipment to ensure safety while maintaining precautions.     Vision     Perception     Praxis      Pertinent Vitals/Pain Pain Assessment: 0-10 Pain Score: 10-Worst pain ever Pain Location: back Pain Descriptors / Indicators: Aching;Operative site guarding;Sore Pain Intervention(s): Monitored during session;Repositioned;Patient requesting pain meds-RN notified     Hand Dominance Right   Extremity/Trunk Assessment Upper Extremity Assessment Upper Extremity Assessment: Overall WFL for tasks assessed   Lower Extremity Assessment Lower Extremity Assessment: Defer to PT evaluation   Cervical / Trunk Assessment Cervical / Trunk Assessment: Other exceptions (recent surgery)   Communication Communication Communication: No difficulties   Cognition Arousal/Alertness: Awake/alert Behavior During Therapy: WFL for tasks assessed/performed Overall Cognitive Status: Within  Functional Limits for tasks assessed                     General Comments    Pt educated in pet care while maintaining back precautions. Provided handout regarding back precautions.    Exercises        Shoulder Instructions      Home Living Family/patient expects to be discharged to:: Private residence Living Arrangements: Alone Available Help at Discharge: Family;Friend(s);Available PRN/intermittently Type of Home: Mobile home Home Access: Stairs to enter Entrance Stairs-Number of Steps: 3 Entrance Stairs-Rails: Can reach both Home Layout: One level     Bathroom Shower/Tub: Chief Strategy Officer: Standard     Home Equipment: Environmental consultant - 2 wheels;Shower seat;Tub bench;Hand held shower head;Transport chair;Adaptive equipment Adaptive Equipment: Reacher;Sock aid;Long-handled sponge Additional Comments: Pt lives alone with cat and dog. Reports son-in-law and church family will be able to assist with meals and light housekeeping      Prior Functioning/Environment Level of Independence: Independent             OT Diagnosis: Generalized weakness;Acute pain   OT Problem List: Decreased strength;Decreased activity tolerance;Impaired balance (sitting and/or standing);Decreased knowledge of use of DME or AE;Pain;Decreased knowledge of precautions   OT Treatment/Interventions: Self-care/ADL training;Therapeutic exercise;DME and/or AE instruction;Therapeutic activities;Patient/family education;Balance training    OT Goals(Current goals can be found in the care plan section) Acute Rehab OT Goals Patient Stated Goal: to return home OT Goal Formulation: With patient Time For Goal Achievement: 09/03/15 Potential to Achieve Goals: Good ADL Goals Pt Will Perform Grooming: with modified independence;standing Pt Will Perform Upper Body Bathing: with modified independence;with adaptive equipment;sitting Pt Will Perform Lower Body Bathing: with modified independence;with adaptive equipment;sitting/lateral leans Pt Will Perform Upper Body Dressing: with modified independence;sitting Pt Will Perform Lower Body Dressing: with modified independence;with adaptive  equipment;sitting/lateral leans;sit to/from stand Pt Will Transfer to Toilet: with modified independence;ambulating;bedside commode Pt Will Perform Toileting - Clothing Manipulation and hygiene: with modified independence;sitting/lateral leans Pt Will Perform Tub/Shower Transfer: Tub transfer;with modified independence;ambulating;tub bench (pt has tub bench) Additional ADL Goal #1: Pt will be verbalize and demonstrate 3 out of 3 back precautions.  OT Frequency: Min 2X/week   Barriers to D/C:            Co-evaluation              End of Session Nurse Communication: Patient requests pain meds  Activity Tolerance: Other (comment) (pt limited by dizziness and nausea) Patient left: in bed;with call bell/phone within reach (sitting EOB)   Time: 1610-9604 OT Time Calculation (min): 30 min Charges:  OT General Charges $OT Visit: 1 Procedure OT Evaluation $Initial OT Evaluation Tier I: 1 Procedure OT Treatments $Self Care/Home Management : 8-22 mins G-Codes:    Smiley Houseman August 29, 2015, 8:22 AM

## 2015-08-20 NOTE — Evaluation (Signed)
Physical Therapy Evaluation Patient Details Name: Michelle NorthMargaret Ritter MRN: [id number] DOB: 14-May-1931 Today's Date: 08/20/2015   History of Present Illness  81 y.o. female s/p bilateral laminotomies and foraminotomies, decompression of spinal canal L3-4 and L4-5, placement of coflex L3-4.  Clinical Impression  Pt admitted with above diagnosis. Pt currently with functional limitations due to the deficits listed below (see PT Problem List). At the time of PT eval pt was able to perform transfers and ambulation with min assist to supervision for safety. Pt states she has all the equipment she needs at home, and pt was educated on the benefits of RW until balance and tolerance for functional activity improve. Pt will benefit from skilled PT to increase their independence and safety with mobility to allow discharge to the venue listed below.       Follow Up Recommendations Home health PT;Supervision for mobility/OOB    Equipment Recommendations  None recommended by PT    Recommendations for Other Services       Precautions / Restrictions Precautions Precautions: Back;Fall Precaution Comments: Reviewed back precautions and handout with pt Restrictions Weight Bearing Restrictions: No      Mobility  Bed Mobility Overal bed mobility: Needs Assistance Bed Mobility: Rolling;Sidelying to Sit Rolling: Supervision Sidelying to sit: Supervision       General bed mobility comments: Step-by-step cues for log roll technique. Pt wanting to just sit straight up in bed rather than roll to the side. Even with initial cues, pt still breaking back precautions.   Transfers Overall transfer level: Needs assistance Equipment used: None Transfers: Sit to/from Stand Sit to Stand: Min guard         General transfer comment: Close guard for safety. Pt was able to power-up to full standing without assist.   Ambulation/Gait Ambulation/Gait assistance: Min assist Ambulation Distance (Feet): 400  Feet Assistive device: 1 person hand held assist Gait Pattern/deviations: Step-through pattern;Decreased stride length;Trunk flexed Gait velocity: Decreased Gait velocity interpretation: Below normal speed for age/gender General Gait Details: VC's for improved posture during gait training. Pt requiring min assist with hand hold to maintain balance during ambulation.  Stairs            Wheelchair Mobility    Modified Rankin (Stroke Patients Only)       Balance Overall balance assessment: Needs assistance Sitting-balance support: Feet supported;No upper extremity supported Sitting balance-Leahy Scale: Good     Standing balance support: No upper extremity supported;During functional activity Standing balance-Leahy Scale: Fair                               Pertinent Vitals/Pain Pain Assessment: Faces Faces Pain Scale: Hurts even more Pain Location: Back Pain Descriptors / Indicators: Operative site guarding;Grimacing Pain Intervention(s): Limited activity within patient's tolerance;Monitored during session;Repositioned    Home Living Family/patient expects to be discharged to:: Private residence Living Arrangements: Alone Available Help at Discharge: Family;Friend(s);Available PRN/intermittently;Neighbor Type of Home: Mobile home Home Access: Stairs to enter Entrance Stairs-Rails: Right;Left;Can reach both Entrance Stairs-Number of Steps: 3 Home Layout: One level Home Equipment: Walker - 2 wheels;Shower seat;Tub bench;Hand held shower head;Transport chair;Adaptive equipment Additional Comments: Pt lives alone with cat and dog. Reports son-in-law and church family will be able to assist with meals and light housekeeping    Prior Function Level of Independence: Independent               Hand Dominance   Dominant Hand: Right  Extremity/Trunk Assessment   Upper Extremity Assessment: Defer to OT evaluation           Lower Extremity  Assessment: Generalized weakness      Cervical / Trunk Assessment: Kyphotic  Communication   Communication: No difficulties  Cognition Arousal/Alertness: Awake/alert Behavior During Therapy: WFL for tasks assessed/performed Overall Cognitive Status: Within Functional Limits for tasks assessed                      General Comments      Exercises        Assessment/Plan    PT Assessment Patient needs continued PT services  PT Diagnosis Difficulty walking;Acute pain   PT Problem List Decreased strength;Decreased range of motion;Decreased activity tolerance;Decreased balance;Decreased mobility;Decreased knowledge of use of DME;Decreased safety awareness;Decreased knowledge of precautions;Pain  PT Treatment Interventions DME instruction;Gait training;Functional mobility training;Stair training;Therapeutic activities;Therapeutic exercise;Neuromuscular re-education;Patient/family education   PT Goals (Current goals can be found in the Care Plan section) Acute Rehab PT Goals Patient Stated Goal: to return home PT Goal Formulation: With patient Time For Goal Achievement: 08/27/15 Potential to Achieve Goals: Good    Frequency Min 5X/week   Barriers to discharge Decreased caregiver support Pt will be home alone for most of her day. Has a son-in-law 5 minutes away if needed.     Co-evaluation               End of Session Equipment Utilized During Treatment: Gait belt Activity Tolerance: Patient tolerated treatment well Patient left: in chair;with call bell/phone within reach Nurse Communication: Mobility status         Time: 1201-1224 PT Time Calculation (min) (ACUTE ONLY): 23 min   Charges:   PT Evaluation $Initial PT Evaluation Tier I: 1 Procedure PT Treatments $Gait Training: 8-22 mins   PT G Codes:        Conni Slipper 09-19-15, 12:40 PM  Conni Slipper, PT, DPT Acute Rehabilitation Services Pager: [phone]

## 2015-08-20 NOTE — Progress Notes (Signed)
Patient ID: Michelle NorthMargaret Ritter, female   DOB: March 22, 1931, 81 y.o.   MRN: [id number] Vital signs stable. Motor function ok Incision clean and dry Nausea is major issue Does not tolerate opioids well at all  will use toradol and add decadron.

## 2015-08-20 NOTE — Progress Notes (Signed)
PT Cancellation Note  Patient Details Name: Michelle Ritter MRN: [id number] DOB: 15-Apr-1931   Cancelled Treatment:    Reason Eval/Treat Not Completed: Pt reports severe nausea and pain at this time and declines working with PT. Will check back as schedule allows to complete PT eval.    Conni SlipperKirkman, Kristyana Notte 08/20/2015, 8:52 AM   Conni SlipperLaura Quinnton Bury, PT, DPT Acute Rehabilitation Services Pager: [phone]

## 2015-08-20 NOTE — Anesthesia Postprocedure Evaluation (Signed)
  Anesthesia Post-op Note  Patient: Patrick NorthMargaret Cronic  Procedure(s) Performed: Procedure(s) with comments: Lumbar three- lumbar four, lumbar four- lumbar five laminectomy and foraminotomy with lumbar three- lumbar four coflex  (Left) - Left L34 L45 lamienctomy and foraminotomy with coflex  Patient Location: PACU  Anesthesia Type:General  Level of Consciousness: awake and alert   Airway and Oxygen Therapy: Patient Spontanous Breathing  Post-op Pain: mild  Post-op Assessment: Post-op Vital signs reviewed LLE Motor Response: Purposeful movement LLE Sensation: Full sensation RLE Motor Response: Purposeful movement RLE Sensation: Full sensation      Post-op Vital Signs: Reviewed  Last Vitals:  Filed Vitals:   08/20/15 2100  BP: 116/54  Pulse: 71  Temp: 36.9 C  Resp: 18    Complications: No apparent anesthesia complications

## 2015-08-21 MED ORDER — DEXAMETHASONE 1 MG PO TABS: ORAL_TABLET | ORAL | Status: AC

## 2015-08-21 MED ORDER — TRAMADOL HCL 50 MG PO TABS: 50.0000 mg | ORAL_TABLET | Freq: Four times a day (QID) | ORAL | Status: AC | PRN

## 2015-08-21 NOTE — Discharge Instructions (Signed)
Wound Care °Leave incision open to air. °You may shower. °Do not scrub directly on incision.  °Do not put any creams, lotions, or ointments on incision. °Activity °Walk each and every day, increasing distance each day. °No lifting greater than 5 lbs.  Avoid excessive neck motion. °No driving for 2 weeks; may ride as a passenger locally. °Wear neck brace at all times except when showering.  If provided soft collar, may wear for comfort unless otherwise instructed. °Diet °Resume your normal diet.  °Return to Work °Will be discussed at you follow up appointment. °Call Your Doctor If Any of These Occur °Redness, drainage, or swelling at the wound.  °Temperature greater than 101 degrees. °Severe pain not relieved by pain medication. °Increased difficulty swallowing. °Incision starts to come apart. °Follow Up Appt °Call today for appointment in 4 weeks ([phone]) or for problems.  If you have any hardware placed in your spine, you will need an x-ray before your appointment. ° °Laminectomy °During a laminectomy, small pieces of bone in the spine called lamina are removed. The ligaments underneath the lamina and parts of the joints that have grown too large are also removed. This takes pressure off the nerves.  °LET YOUR HEALTH CARE PROVIDER KNOW ABOUT: °· Any allergies you have. °· All medicines you are taking, including vitamins, herbs, eye drops, creams, and over-the-counter medicines. °· Previous problems you or members of your family have had with the use of anesthetics. °· Any blood disorders you have. °· Previous surgeries you have had. °· Medical conditions you have. °RISKS AND COMPLICATIONS  °Generally, laminectomy is a safe procedure. However, as with any procedure, complications can occur. Possible complications include: °· Infection near the incision. °· Nerve damage. Signs of this can be pain, weakness, or numbness. °· Leaking of spinal fluid. °· Blood clot in a leg. The clot can move to the lungs. This can be  very serious. °· Bowel or bladder incontinence (rare). °BEFORE THE PROCEDURE  °· You will need to stop taking certain medicines as directed by your health care provider. °· If you smoke, stop at least 2 weeks before the procedure. Smoking can slow down the healing process and increase the risk of complications. °· Do not eat or drink anything for at least 8 hours before the procedure. Take any medicines that your health care provider tells you to keep taking with a sip of water. °· Do not drink alcohol the day before your surgery. °· Tell your health care provider if you develop a cold or any infection before your surgery. °· Arrange for someone to drive you home after the procedure or after your hospital stay. Also arrange for someone to help you with activities during recovery. °PROCEDURE °· Small monitors will be placed on your body. They are used to check your heart, blood pressure, and oxygen level. °· An IV tube will be inserted into one of your veins. Medicine will flow directly into your body through the IV tube. °· You might be given a sedative. This will help you relax. °· You will be given a medicine to make you sleep (general anesthetic), and a breathing tube will be placed into your lungs. During general anesthesia, you are unaware of the procedure and do not feel any pain. °· Your back will be cleaned with a special solution to kill germs on your skin. °· Once you are asleep, the surgeon will make a 2-inch to 5-inch cut (incision) in your back. The length of the incision   will depend on how many spinal bones (vertebrae) are being operated on. °· Muscles in the back will be moved away from the vertebrae and pulled to the side. °· Pieces of lamina will be removed. °· The ligament that lies under the lamina and connects your vertebrae will be removed. °· Enough ligaments and thickened joints will be removed to take pressure off your nerves. °· Your nerves will be identified, and their passage will be  tracked and assessed for excessive tightness. °· Your back muscles will be moved back into their normal position. °· The area under your skin will be closed with small, absorbable stitches. These stitches do not need to be removed. °· Your skin will be closed with small absorbable stitches or staples. °· A dressing will be put over your incision. °· The procedure may take 1-3 hours. °AFTER THE PROCEDURE  °· You will stay in a recovery area until the anesthesia has worn off. Your blood pressure and pulse will be checked every so often. Then you will be taken to a hospital room. °· You may continue to get fluids through the IV tube for a while. °· Some pain is normal. You may be given pain medicine while still in the recovery area. °· It is important to be up and moving as soon as possible after a surgery. Physical therapists will help you start walking. °· To prevent blood clots in your legs: °¨ You may be given special stockings to wear. °¨ You may need to take medicine to prevent clots. °· You may be asked to do special breathing exercises to re-expand your lungs. This is to prevent a lung infection. °· Most people stay in the hospital for 1-3 days after a laminectomy. °  °This information is not intended to replace advice given to you by your health care provider. Make sure you discuss any questions you have with your health care provider. °  °Document Released: 09/23/2009 Document Revised: 07/26/2013 Document Reviewed: 05/17/2013 °Elsevier Interactive Patient Education ©2016 [employer] ° °

## 2015-08-21 NOTE — Progress Notes (Signed)
Pt doing well. Pt given D/C instructions with Rx's, verbal understanding was provided. Pt's incision was clean and dry with no sign of infection. Pt's IV was removed prior to D/C. Pt D/C'd home via wheelchair @ 1150 per MD order. Pt is stable @ D/C and has no other needs at this time. Rema FendtAshley Alesandro Stueve, RN

## 2015-08-21 NOTE — Progress Notes (Signed)
Physical Therapy Treatment Patient Details Name: Michelle NorthMargaret Ritter MRN: [id number] DOB: Jun 05, 1931 Today's Date: 08/21/2015    History of Present Illness 81 y.o. female s/p bilateral laminotomies and foraminotomies, decompression of spinal canal L3-4 and L4-5, placement of coflex L3-4.    PT Comments    Pt progressing towards physical therapy goals. Was able to perform transfers and ambulation with supervision for safety. Encouraged pt to have family assist her at home at least the first few days, but pt declined - she is very independent and feels she can manage well alone (family available ~5 mins away if needed). Feel pt is doing well enough to defer HHPT to outpatient when appropriate per post-op protocol.   Follow Up Recommendations  Outpatient PT;Supervision for mobility/OOB     Equipment Recommendations  None recommended by PT    Recommendations for Other Services       Precautions / Restrictions Precautions Precautions: Back;Fall Precaution Booklet Issued: Yes (comment) Precaution Comments: Pt was able to recall 2/3 back precautions.  Restrictions Weight Bearing Restrictions: No    Mobility  Bed Mobility Overal bed mobility: Needs Assistance Bed Mobility: Rolling;Sidelying to Sit Rolling: Supervision Sidelying to sit: Supervision       General bed mobility comments: Pt sitting up in the recliner upon PT arrival.   Transfers Overall transfer level: Needs assistance Equipment used: None Transfers: Sit to/from Stand Sit to Stand: Supervision         General transfer comment: No physical assist required. No unsteadiness noted.   Ambulation/Gait Ambulation/Gait assistance: Supervision Ambulation Distance (Feet): 400 Feet Assistive device: None Gait Pattern/deviations: Trunk flexed Gait velocity: Decreased Gait velocity interpretation: Below normal speed for age/gender General Gait Details: Generally slow but steady gait. No assist required.     Stairs Stairs: Yes Stairs assistance: Supervision Stair Management: One rail Right;Step to pattern;Forwards Number of Stairs: 5 General stair comments: VC's for sequencing and technique.   Wheelchair Mobility    Modified Rankin (Stroke Patients Only)       Balance Overall balance assessment: Needs assistance Sitting-balance support: Feet supported;No upper extremity supported Sitting balance-Leahy Scale: Good     Standing balance support: No upper extremity supported Standing balance-Leahy Scale: Fair                      Cognition Arousal/Alertness: Awake/alert Behavior During Therapy: WFL for tasks assessed/performed Overall Cognitive Status: Within Functional Limits for tasks assessed                      Exercises      General Comments        Pertinent Vitals/Pain Pain Assessment: Faces Pain Score: 5  Faces Pain Scale: Hurts a little bit Pain Location: Back Pain Descriptors / Indicators: Operative site guarding Pain Intervention(s): Limited activity within patient's tolerance;Monitored during session;Repositioned    Home Living                      Prior Function            PT Goals (current goals can now be found in the care plan section) Acute Rehab PT Goals Patient Stated Goal: go home today PT Goal Formulation: With patient Time For Goal Achievement: 08/27/15 Potential to Achieve Goals: Good Progress towards PT goals: Progressing toward goals    Frequency  Min 5X/week    PT Plan Discharge plan needs to be updated    Co-evaluation  End of Session Equipment Utilized During Treatment: Back brace Activity Tolerance: Patient tolerated treatment well Patient left: in chair;with call bell/phone within reach     Time: 0840-0858 PT Time Calculation (min) (ACUTE ONLY): 18 min  Charges:  $Gait Training: 8-22 mins                    G Codes:      Conni Slipper 22-Aug-2015, 9:17 AM  Conni Slipper, PT, DPT Acute Rehabilitation Services Pager: [phone]

## 2015-08-21 NOTE — Discharge Summary (Signed)
Physician Discharge Summary  Patient ID: Michelle Ritter MRN: [id number] DOB/AGE: 07/16/1931 81 y.o.  Admit date: 08/19/2015 Discharge date: 08/21/2015  Admission Diagnoses: Lumbar spinal stenosis L3-4 L4-5 with neurogenic claudication lumbar radiculopathy. Intolerance to opiates  Discharge Diagnoses: Lumbar spinal stenosis L3-4 L4-5 with neurogenic claudication and lumbar radiculopathy. Intolerance to opiates Active Problems:   Lumbar stenosis   Discharged Condition: good  Hospital Course: Patient was admitted to undergo surgery and tolerated this well. She was very intolerant of even the mildest of opiates and was substantially nauseated during the first 24 hours postop  Consults: None  Significant Diagnostic Studies: None  Treatments: surgery: Bilateral decompression of with laminotomy and foraminotomies L3-4 and L4-5 placement of Coflex L3-4  Discharge Exam: Blood pressure 118/57, pulse 78, temperature 97.9 F (36.6 C), temperature source Oral, resp. rate 18, height  (1.6 m), weight 71.578 kg (157 lb 12.8 oz), SpO2 98 %. Incision is clean dry station and gait are intact.  Disposition: Discharge home  Discharge Instructions    Call MD for:  redness, tenderness, or signs of infection (pain, swelling, redness, odor or green/yellow discharge around incision site)    Complete by:  As directed      Call MD for:  severe uncontrolled pain    Complete by:  As directed      Call MD for:  temperature >100.4    Complete by:  As directed      Diet - low sodium heart healthy    Complete by:  As directed      Discharge instructions    Complete by:  As directed   Okay to shower. Do not apply salves or appointments to incision. No heavy lifting with the upper extremities greater than 15 pounds. May resume driving when not requiring pain medication and patient feels comfortable with doing so.     Increase activity slowly    Complete by:  As directed             Medication List     TAKE these medications        acetaminophen 500 MG tablet  Commonly known as:  TYLENOL  Take 500 mg by mouth daily as needed.     amLODipine 5 MG tablet  Commonly known as:  NORVASC  Take 5 mg by mouth daily.     aspirin 81 MG tablet  Take 324 mg by mouth daily.     dexamethasone 1 MG tablet  Commonly known as:  DECADRON  2 tablets twice daily for 2 days, one tablet twice daily for 2 days, one tablet daily for 2 days.     ferrous sulfate 325 (65 FE) MG tablet  Take 325 mg by mouth 2 (two) times daily.     nitroGLYCERIN 0.4 MG SL tablet  Commonly known as:  NITROSTAT  Place 0.4 mg under the tongue every 5 (five) minutes as needed for chest pain.     Omeprazole-Sodium Bicarbonate 20-1100 MG Caps capsule  Commonly known as:  ZEGERID  Take 1 capsule by mouth as needed (upset stomach or ingestion).     pantoprazole 40 MG tablet  Commonly known as:  PROTONIX  Take 40 mg by mouth 2 (two) times daily.     sertraline 25 MG tablet  Commonly known as:  ZOLOFT  Take 25 mg by mouth daily.     traMADol 50 MG tablet  Commonly known as:  ULTRAM  Take 1 tablet (50 mg total) by mouth every 6 (six)  hours as needed for moderate pain.         SignedStefani Dama: Aniesha Haughn J 08/21/2015, 9:26 AM

## 2015-08-21 NOTE — Progress Notes (Signed)
Occupational Therapy Treatment Patient Details Name: Michelle Ritter MRN: [id number] DOB: 1931-01-26 Today's Date: 08/21/2015    History of present illness 81 y.o. female s/p bilateral laminotomies and foraminotomies, decompression of spinal canal L3-4 and L4-5, placement of coflex L3-4.   OT comments  Focus of session included toilet transfer with toileting hygiene and tub transfer. Reviewed donning and doffing of back brace and wearing schedule. Pt able to recall 3/3 back precautions using teach back method and retrun safe demonstration of ADLs while maintaining precautions. All education complete and goals met.  Follow Up Recommendations  Supervision - Intermittent    Equipment Recommendations  None recommended by OT    Recommendations for Other Services      Precautions / Restrictions Precautions Precautions: Back;Fall Precaution Comments: Reviewed back precautions and handout with pt Restrictions Weight Bearing Restrictions: No       Mobility Bed Mobility Overal bed mobility: Needs Assistance Bed Mobility: Rolling;Sidelying to Sit Rolling: Supervision Sidelying to sit: Supervision       General bed mobility comments: used teach back method for log roll technique review.  Transfers Overall transfer level: Needs assistance Equipment used: None Transfers: Sit to/from Stand Sit to Stand: Supervision         General transfer comment: during sit to stand from toilet, pt utilized grab bars, however sit to stand from bed pt did not require any assistance    Balance Overall balance assessment: Needs assistance Sitting-balance support: No upper extremity supported;Feet supported Sitting balance-Leahy Scale: Good     Standing balance support: No upper extremity supported Standing balance-Leahy Scale: Fair                     ADL Overall ADL's : Needs assistance/impaired       Grooming Details (indicate cue type and reason): reviwed two cup method for  oral care           Upper Body Dressing Details (indicate cue type and reason): reviwed donning back brace in sitting position and wearing schedule     Toilet Transfer: Supervision/safety;Ambulation;Grab bars;Comfort height toilet   Toileting- Clothing Manipulation and Hygiene: Supervision/safety;Sitting/lateral lean Toileting - Clothing Manipulation Details (indicate cue type and reason): reviewed using wet wipes for peri-care to assist with maintaining back precautions. Educated pt in toileting aid options for toileting hygiene   Tub/Shower Transfer Details (indicate cue type and reason): simulated tub transfer using chair, reviewed maintaining back precautions during transfer. Pt has tub transfer bench to assist with safe transfer Functional mobility during ADLs: Supervision/safety General ADL Comments: Reviwed adaptive equipment to assist with ADL safety and maintain back precautions.      Vision                     Perception     Praxis      Cognition   Behavior During Therapy: WFL for tasks assessed/performed Overall Cognitive Status: Within Functional Limits for tasks assessed                       Extremity/Trunk Assessment               Exercises     Shoulder Instructions       General Comments      Pertinent Vitals/ Pain       Pain Assessment: 0-10 Pain Score: 5  Pain Location: back Pain Descriptors / Indicators: Operative site guarding;Sore Pain Intervention(s): Monitored during session;Repositioned  Home Living  Prior Functioning/Environment              Frequency       Progress Toward Goals  OT Goals(current goals can now be found in the care plan section)  Progress towards OT goals: Goals met/education completed, patient discharged from OT  Acute Rehab OT Goals Patient Stated Goal: go home today OT Goal Formulation: With patient Time For Goal  Achievement: 09/03/15 Potential to Achieve Goals: Good ADL Goals Pt Will Perform Grooming: with modified independence;standing Pt Will Perform Upper Body Bathing: with modified independence;with adaptive equipment;sitting Pt Will Perform Lower Body Bathing: with modified independence;with adaptive equipment;sitting/lateral leans Pt Will Perform Upper Body Dressing: with modified independence;sitting Pt Will Perform Lower Body Dressing: with modified independence;with adaptive equipment;sitting/lateral leans;sit to/from stand Pt Will Transfer to Toilet: with modified independence;ambulating;bedside commode Pt Will Perform Toileting - Clothing Manipulation and hygiene: with modified independence;sitting/lateral leans Pt Will Perform Tub/Shower Transfer: Tub transfer;with modified independence;ambulating;tub bench Additional ADL Goal #1: Pt will be verbalize and demonstrate 3 out of 3 back precautions.  Plan All goals met and education completed, patient discharged from OT services    Co-evaluation                 End of Session Equipment Utilized During Treatment: Gait belt;Back brace   Activity Tolerance Patient tolerated treatment well   Patient Left in chair;with call bell/phone within reach   Nurse Communication          Time: 0721-0741 OT Time Calculation (min): 20 min  Charges: OT General Charges $OT Visit: 1 Procedure OT Treatments $Self Care/Home Management : 8-22 mins  Lin Landsman 08/21/2015, 9:14 AM

## 2016-01-05 ENCOUNTER — Encounter: Payer: Self-pay | Admitting: Emergency Medicine

## 2016-01-05 ENCOUNTER — Emergency Department (INDEPENDENT_AMBULATORY_CARE_PROVIDER_SITE_OTHER)
Admission: EM | Admit: 2016-01-05 | Discharge: 2016-01-05 | Disposition: A | Discharge status: Home / Self Care | Payer: Medicare Other | Source: Home / Self Care | Attending: Family Medicine | Admitting: Family Medicine

## 2016-01-05 DIAGNOSIS — J069 Acute upper respiratory infection, unspecified: Secondary | ICD-10-CM

## 2016-01-05 DIAGNOSIS — B9789 Other viral agents as the cause of diseases classified elsewhere: Principal | ICD-10-CM

## 2016-01-05 MED ORDER — BENZONATATE 200 MG PO CAPS: 200.0000 mg | ORAL_CAPSULE | Freq: Every day | ORAL | Status: AC

## 2016-01-05 MED ORDER — DOXYCYCLINE HYCLATE 100 MG PO CAPS: 100.0000 mg | ORAL_CAPSULE | Freq: Two times a day (BID) | ORAL | Status: AC

## 2016-01-05 NOTE — ED Notes (Signed)
Pt c/o possible sinus infection, head congestion, scheduled for surgery next week

## 2016-01-05 NOTE — ED Provider Notes (Signed)
CSN: [id number]     Arrival date & time 01/05/16  1141 History   First MD Initiated Contact with Patient 01/05/16 1235     Chief Complaint  Patient presents with  . Facial Pain      HPI Comments: Patient complains of five day history of typical cold-like symptoms developing over several days,  including mild sore throat, sinus congestion, fatigue, and cough.  She is scheduled for lumbar disc surgery next week, and she is concerned that she may have a sinus infection.  The history is provided by the patient.    Past Medical History  Diagnosis Date  . Hypertension   . Bronchitis     hx of  . Diverticulosis   . History of kidney stones   . GERD (gastroesophageal reflux disease)   . Arthritis   . Anemia   . DVT (deep venous thrombosis) (HCC)   . PE (pulmonary thromboembolism) (HCC)   . Coronary artery disease    Past Surgical History  Procedure Laterality Date  . Hip arthroplasty Left   . Cardiac catheterization    . Coronary angioplasty      X 1 stent  . Cholecystectomy    . Ankle surgery Left   . Colonoscopy w/ polypectomy    . Eye surgery Bilateral     cataract removal  . Abdominal hysterectomy    . Appendectomy    . Tonsillectomy    . Lumbar laminectomy with coflex 2 level Left 08/19/2015    Procedure: Lumbar three- lumbar four, lumbar four- lumbar five laminectomy and foraminotomy with lumbar three- lumbar four coflex ;  Surgeon: Barnett Abu, MD;  Location: [facility];  Service: Neurosurgery;  Laterality: Left;  Left L34 L45 lamienctomy and foraminotomy with coflex   No family history on file. Social History  Substance Use Topics  . Smoking status: Former Games developer  . Smokeless tobacco: None  . Alcohol Use: No   OB History    No data available     Review of Systems + sore throat + hoarse + cough + sneezing No pleuritic pain + wheezing + nasal congestion + post-nasal drainage + sinus pain/pressure No itchy/red eyes No earache No hemoptysis + SOB No  fever/chills No nausea No vomiting No abdominal pain No diarrhea No urinary symptoms No skin rash + fatigue No myalgias No headache Used OTC meds without relief  Allergies  Penicillins; Lipitor; Xarelto; and Codeine  Home Medications   Prior to Admission medications   Medication Sig Start Date End Date Taking? Authorizing Provider  acetaminophen (TYLENOL) 500 MG tablet Take 500 mg by mouth daily as needed.    Historical Provider, MD  amLODipine (NORVASC) 5 MG tablet Take 5 mg by mouth daily.    Historical Provider, MD  aspirin 81 MG tablet Take 324 mg by mouth daily.    Historical Provider, MD  benzonatate (TESSALON) 200 MG capsule Take 1 capsule (200 mg total) by mouth at bedtime. Take as needed for cough 01/05/16   Lattie Haw, MD  dexamethasone (DECADRON) 1 MG tablet 2 tablets twice daily for 2 days, one tablet twice daily for 2 days, one tablet daily for 2 days. 08/21/15   Barnett Abu, MD  doxycycline (VIBRAMYCIN) 100 MG capsule Take 1 capsule (100 mg total) by mouth 2 (two) times daily. Take with food. 01/05/16   Lattie Haw, MD  ferrous sulfate 325 (65 FE) MG tablet Take 325 mg by mouth 2 (two) times daily.  Historical Provider, MD  nitroGLYCERIN (NITROSTAT) 0.4 MG SL tablet Place 0.4 mg under the tongue every 5 (five) minutes as needed for chest pain.    Historical Provider, MD  Omeprazole-Sodium Bicarbonate (ZEGERID) 20-1100 MG CAPS capsule Take 1 capsule by mouth as needed (upset stomach or ingestion).    Historical Provider, MD  pantoprazole (PROTONIX) 40 MG tablet Take 40 mg by mouth 2 (two) times daily.    Historical Provider, MD  sertraline (ZOLOFT) 25 MG tablet Take 25 mg by mouth daily.    Historical Provider, MD  traMADol (ULTRAM) 50 MG tablet Take 1 tablet (50 mg total) by mouth every 6 (six) hours as needed for moderate pain. 08/21/15   Barnett AbuHenry Elsner, MD   Meds Ordered and Administered this Visit  Medications - No data to display  BP 152/76 mmHg  Pulse 76   Temp(Src) 97.7 F (36.5 C) (Oral)  Ht 5\' 3"  (1.6 m)  Wt 153 lb (69.4 kg)  BMI 27.11 kg/m2  SpO2 98% No data found.   Physical Exam Nursing notes and Vital Signs reviewed. Appearance:  Patient appears stated age, and in no acute distress Eyes:  Pupils are equal, round, and reactive to light and accomodation.  Extraocular movement is intact.  Conjunctivae are not inflamed  Ears:  Canals normal.  Tympanic membranes normal.  Nose:  Mildly congested turbinates.   Maxillary sinus tenderness is present.  Pharynx:  Normal Neck:  Supple.  Tender enlarged posterior/lateral nodes are palpated bilaterally  Lungs:  Clear to auscultation.  Breath sounds are equal.  Moving air well. Heart:  Regular rate and rhythm without murmurs, rubs, or gallops.  Abdomen:  Nontender without masses or hepatosplenomegaly.  Bowel sounds are present.  No CVA or flank tenderness.  Extremities:  No edema.  Skin:  No rash present.   ED Course  Procedures none   MDM   1. Viral URI with cough    Patient is planning lumbar disc surgery soon. Begin empiric doxycycline 100mg  BID Prescription written for Benzonatate (Tessalon) to take at bedtime for night-time cough.  Take plain guaifenesin (1200mg  extended release tabs such as Mucinex) twice daily, with plenty of water, for cough and congestion.  Get adequate rest.   May use Afrin nasal spray (or generic oxymetazoline) twice daily for about 5 days and then discontinue.  Also recommend using saline nasal spray several times daily and saline nasal irrigation (AYR is a common brand).  Use Flonase nasal spray each morning after using Afrin nasal spray and saline nasal irrigation. Try warm salt water gargles for sore throat.  Stop all antihistamines for now, and other non-prescription cough/cold preparations.   Follow-up with family doctor if not improving about 8 days.    Lattie HawStephen A Tyller Bowlby, MD 01/08/16 1057

## 2016-01-05 NOTE — Discharge Instructions (Signed)
Take plain guaifenesin (1200mg  extended release tabs such as Mucinex) twice daily, with plenty of water, for cough and congestion.  Get adequate rest.   May use Afrin nasal spray (or generic oxymetazoline) twice daily for about 5 days and then discontinue.  Also recommend using saline nasal spray several times daily and saline nasal irrigation (AYR is a common brand).  Use Flonase nasal spray each morning after using Afrin nasal spray and saline nasal irrigation. Try warm salt water gargles for sore throat.  Stop all antihistamines for now, and other non-prescription cough/cold preparations.   Follow-up with family doctor if not improving about 8 days.

## 2016-03-29 IMAGING — RF DG C-ARM 61-120 MIN
1 series · 2 of 2 positions shown · non-contrast
Comparison: None.

CLINICAL DATA: Lumbar fusion.

EXAM:
DG C-ARM 61-120 MIN

[Series 1: run · 2 of 2 slices shown]
[im 1/2]
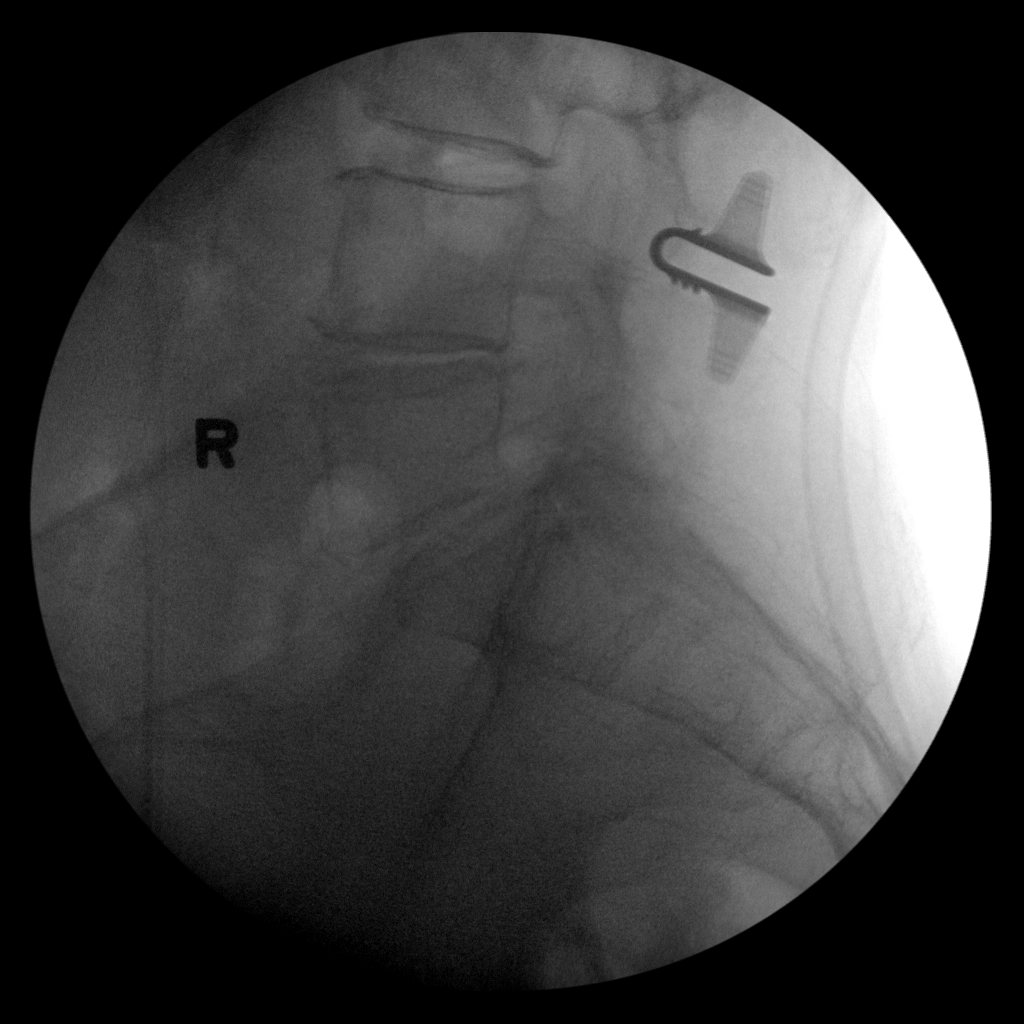
[im 2/2]
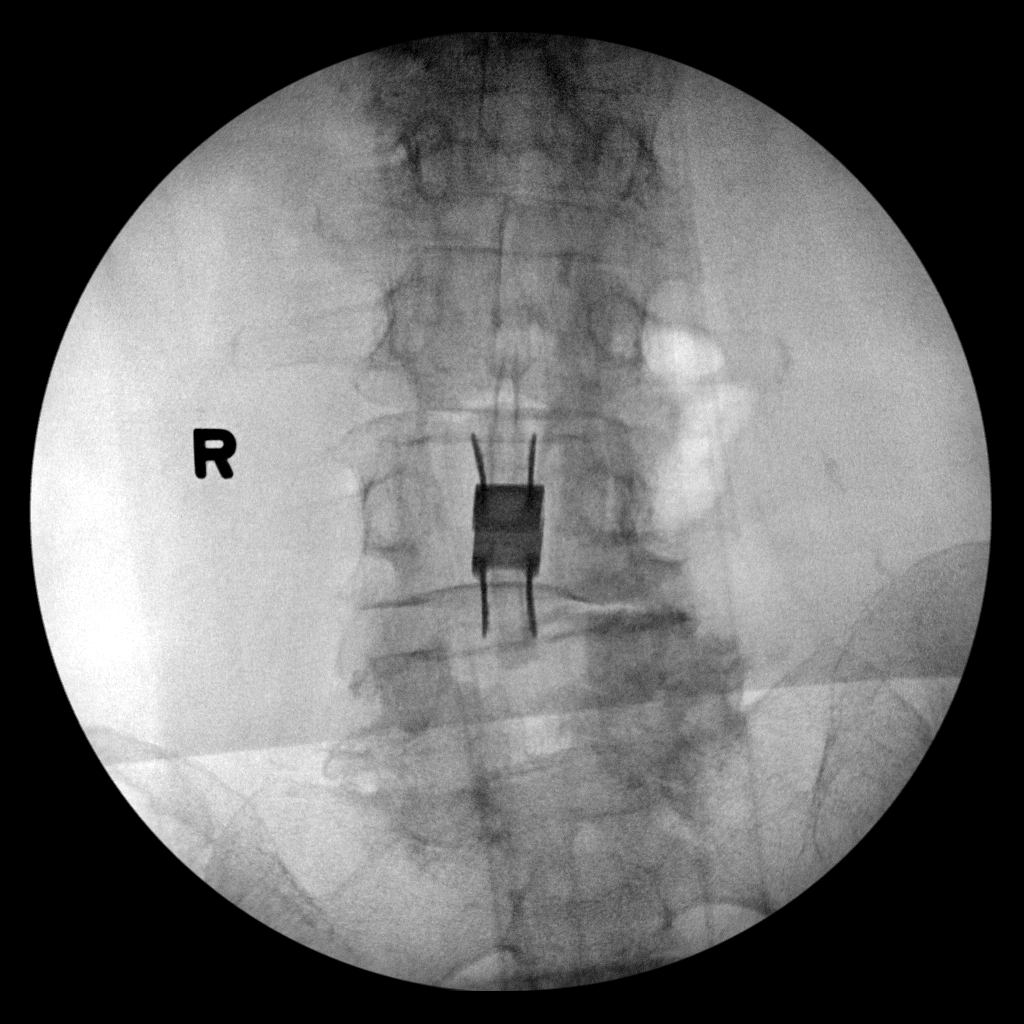

[2 of 2 positions shown; findings below may reference images not displayed]

FINDINGS: Two views study shows a posterior fixation device at the level of
the L3-4 spinous processes.
IMPRESSION: Intraoperative localization.

## 2017-05-05 ENCOUNTER — Ambulatory Visit: Payer: Medicare Other | Admitting: Cardiology

## 2017-06-25 ENCOUNTER — Other Ambulatory Visit (HOSPITAL_COMMUNITY): Payer: Medicare Other

## 2017-07-07 ENCOUNTER — Inpatient Hospital Stay: Admit: 2017-07-07 | Payer: Medicare Other | Admitting: Orthopedic Surgery

## 2017-07-07 SURGERY — ARTHROPLASTY, KNEE, TOTAL
Anesthesia: Spinal | Laterality: Right

## 2023-05-20 DEATH — deceased
